# Patient Record
Sex: Male | Born: 1990 | Race: Black or African American | Hispanic: No | Marital: Single | Smoking: Never smoker
Health system: Southern US, Community
[De-identification: ages and names within clinical notes are randomized; demographics above are authoritative.]

---

## 2015-04-19 ENCOUNTER — Emergency Department: Payer: BLUE CROSS/BLUE SHIELD

## 2015-04-19 ENCOUNTER — Emergency Department
Admission: EM | Admit: 2015-04-19 | Discharge: 2015-04-19 | Disposition: A | Discharge status: Home / Self Care | Payer: BLUE CROSS/BLUE SHIELD | Attending: Emergency Medicine | Admitting: Emergency Medicine

## 2015-04-19 DIAGNOSIS — R0789 Other chest pain: Secondary | ICD-10-CM

## 2015-04-19 DIAGNOSIS — R1084 Generalized abdominal pain: Secondary | ICD-10-CM | POA: Diagnosis present

## 2015-04-19 DIAGNOSIS — F17200 Nicotine dependence, unspecified, uncomplicated: Secondary | ICD-10-CM | POA: Insufficient documentation

## 2015-04-19 LAB — URINALYSIS COMPLETE WITH MICROSCOPIC (ARMC ONLY)
BILIRUBIN URINE: NEGATIVE
Bacteria, UA: NONE SEEN
Glucose, UA: NEGATIVE mg/dL
Hgb urine dipstick: NEGATIVE
Ketones, ur: NEGATIVE mg/dL
Leukocytes, UA: NEGATIVE
Nitrite: NEGATIVE
PH: 5 (ref 5.0–8.0)
PROTEIN: NEGATIVE mg/dL
Specific Gravity, Urine: 1.025 (ref 1.005–1.030)

## 2015-04-19 LAB — LIPASE, BLOOD: LIPASE: 30 U/L (ref 11–51)

## 2015-04-19 LAB — COMPREHENSIVE METABOLIC PANEL
ALBUMIN: 4.2 g/dL (ref 3.5–5.0)
ALT: 28 U/L (ref 17–63)
AST: 22 U/L (ref 15–41)
Alkaline Phosphatase: 72 U/L (ref 38–126)
Anion gap: 6 (ref 5–15)
BUN: 13 mg/dL (ref 6–20)
CHLORIDE: 104 mmol/L (ref 101–111)
CO2: 27 mmol/L (ref 22–32)
CREATININE: 1.09 mg/dL (ref 0.61–1.24)
Calcium: 9.5 mg/dL (ref 8.9–10.3)
GFR calc non Af Amer: >60 mL/min (ref >60)
Glucose, Bld: 97 mg/dL (ref 65–99)
Potassium: 3.7 mmol/L (ref 3.5–5.1)
SODIUM: 137 mmol/L (ref 135–145)
Total Bilirubin: 0.5 mg/dL (ref 0.3–1.2)
Total Protein: 7.6 g/dL (ref 6.5–8.1)

## 2015-04-19 LAB — CBC
HCT: 43.2 % (ref 40.0–52.0)
Hemoglobin: 15 g/dL (ref 13.0–18.0)
MCH: 31.1 pg (ref 26.0–34.0)
MCHC: 34.6 g/dL (ref 32.0–36.0)
MCV: 89.7 fL (ref 80.0–100.0)
PLATELETS: 237 10*3/uL (ref 150–440)
RBC: 4.82 MIL/uL (ref 4.40–5.90)
RDW: 12.5 % (ref 11.5–14.5)
WBC: 4.6 10*3/uL (ref 3.8–10.6)

## 2015-04-19 MED ORDER — FAMOTIDINE 20 MG PO TABS: 20.0000 mg | ORAL_TABLET | Freq: Two times a day (BID) | ORAL | Status: AC

## 2015-04-19 MED ORDER — NAPROXEN 500 MG PO TABS: 500.0000 mg | ORAL_TABLET | Freq: Two times a day (BID) | ORAL | Status: AC

## 2015-04-19 NOTE — ED Notes (Signed)
Pt arrives to ER via POV c/o abdominal pain in lower quadrants, sharp. Chest tightness intermittently and fatigue. PT states that these sx has been going on "for a while". Pt alert and oriented X4, active, cooperative, pt in NAD. RR even and unlabored, color WNL.   Denies NVD.

## 2015-04-19 NOTE — Discharge Instructions (Signed)
Abdominal Pain, Adult °Many things can cause abdominal pain. Usually, abdominal pain is not caused by a disease and will improve without treatment. It can often be observed and treated at home. Your health care provider will do a physical exam and possibly order blood tests and X-rays to help determine the seriousness of your pain. However, in many cases, more time must pass before a clear cause of the pain can be found. Before that point, your health care provider may not know if you need more testing or further treatment. °HOME CARE INSTRUCTIONS °Monitor your abdominal pain for any changes. The following actions may help to alleviate any discomfort you are experiencing: °· Only take over-the-counter or prescription medicines as directed by your health care provider. °· Do not take laxatives unless directed to do so by your health care provider. °· Try a clear liquid diet (broth, tea, or water) as directed by your health care provider. Slowly move to a bland diet as tolerated. °SEEK MEDICAL CARE IF: °· You have unexplained abdominal pain. °· You have abdominal pain associated with nausea or diarrhea. °· You have pain when you urinate or have a bowel movement. °· You experience abdominal pain that wakes you in the night. °· You have abdominal pain that is worsened or improved by eating food. °· You have abdominal pain that is worsened with eating fatty foods. °· You have a fever. °SEEK IMMEDIATE MEDICAL CARE IF: °· Your pain does not go away within 2 hours. °· You keep throwing up (vomiting). °· Your pain is felt only in portions of the abdomen, such as the right side or the left lower portion of the abdomen. °· You pass bloody or black tarry stools. °MAKE SURE YOU: °· Understand these instructions. °· Will watch your condition. °· Will get help right away if you are not doing well or get worse. °  °This information is not intended to replace advice given to you by your health care provider. Make sure you discuss  any questions you have with your health care provider. °  °Document Released: 10/12/2004 Document Revised: 09/23/2014 Document Reviewed: 09/11/2012 °Elsevier Interactive Patient Education ©2016 [employer] ° °Chest Wall Pain °Chest wall pain is pain in or around the bones and muscles of your chest. Sometimes, an injury causes this pain. Sometimes, the cause may not be known. This pain may take several weeks or longer to get better. °HOME CARE INSTRUCTIONS  °Pay attention to any changes in your symptoms. Take these actions to help with your pain:  °· Rest as told by your health care provider.   °· Avoid activities that cause pain. These include any activities that use your chest muscles or your abdominal and side muscles to lift heavy items.    °· If directed, apply ice to the painful area: °¨ Put ice in a plastic bag. °¨ Place a towel between your skin and the bag. °¨ Leave the ice on for 20 minutes, 2-3 times per day. °· Take over-the-counter and prescription medicines only as told by your health care provider. °· Do not use tobacco products, including cigarettes, chewing tobacco, and e-cigarettes. If you need help quitting, ask your health care provider. °· Keep all follow-up visits as told by your health care provider. This is important. °SEEK MEDICAL CARE IF: °· You have a fever. °· Your chest pain becomes worse. °· You have new symptoms. °SEEK IMMEDIATE MEDICAL CARE IF: °· You have nausea or vomiting. °· You feel sweaty or light-headed. °· You have   a cough with phlegm (sputum) or you cough up blood.  You develop shortness of breath.   This information is not intended to replace advice given to you by your health care provider. Make sure you discuss any questions you have with your health care provider.   Document Released: 01/02/2005 Document Revised: 09/23/2014 Document Reviewed: 03/30/2014 Elsevier Interactive Patient Education [employer]

## 2015-04-19 NOTE — ED Notes (Signed)
Pt reports  Left side chest pain and abd pain.  Pt states he has felt tired since yesterday.   Denies n/v/d.   Pt alert.  Speech clear.

## 2015-04-19 NOTE — ED Provider Notes (Signed)
[facility] Emergency Department Provider Note  ____________________________________________  Time seen: 4:40 PM  I have reviewed the triage vital signs and the nursing notes.   HISTORY  Chief Complaint Abdominal Pain and Fatigue    HPI Adrian Newman is a 25 y.o. male who complains of central chest pain that is nonradiating that started yesterday. It is intermittent lasting for only a second at a time with sharp pangs of pain. No shortness of breath. Not exertional, not pleuritic. No other aggravating or alleviating factors. Has some nasal congestion but denies cough or sore throat. No fevers or chills. Also complains of some diffuse abdominal pain without nausea vomiting or diarrhea. He is tolerating oral intake.     History reviewed. No pertinent past medical history.   There are no active problems to display for this patient.    History reviewed. No pertinent past surgical history.   Current Outpatient Rx  Name  Route  Sig  Dispense  Refill  . famotidine (PEPCID) 20 MG tablet   Oral   Take 1 tablet (20 mg total) by mouth 2 (two) times daily.   60 tablet   0   . naproxen (NAPROSYN) 500 MG tablet   Oral   Take 1 tablet (500 mg total) by mouth 2 (two) times daily with a meal.   20 tablet   0      Allergies Review of patient's allergies indicates no known allergies.   No family history on file.  Social History Social History  Substance Use Topics  . Smoking status: Current Every Day Smoker  . Smokeless tobacco: None  . Alcohol Use: No    Review of Systems  Constitutional:   No fever or chills. No weight changes Eyes:   No vision changes.  ENT:   No sore throat. Positive congestion Cardiovascular:   Positive chest pain as above. Respiratory:   No dyspnea or cough. Gastrointestinal:   Positive generalized abdominal pain without vomiting or diarrhea.  No BRBPR or melena. Genitourinary:   Negative for dysuria or difficulty  urinating. Musculoskeletal:   Negative for focal pain or swelling Skin:   Negative for rash. Neurological:   Negative for headaches, focal weakness or numbness.  10-point ROS otherwise negative.  ____________________________________________   PHYSICAL EXAM:  VITAL SIGNS: ED Triage Vitals  Enc Vitals Group     BP 04/19/15 1458 133/110 mmHg     Pulse Rate 04/19/15 1458 72     Resp 04/19/15 1458 18     Temp 04/19/15 1458 98.2 F (36.8 C)     Temp Source 04/19/15 1458 Oral     SpO2 04/19/15 1458 100 %     Weight 04/19/15 1458 200 lb (90.719 kg)     Height 04/19/15 1458  (1.88 m)     Head Cir --      Peak Flow --      Pain Score 04/19/15 1458 7     Pain Loc --      Pain Edu? --      Excl. in GC? --     Vital signs reviewed, nursing assessments reviewed.   Constitutional:   Alert and oriented. Well appearing and in no distress. Eyes:   No scleral icterus. No conjunctival pallor. PERRL. EOMI ENT   Head:   Normocephalic and atraumatic.   Nose:   No congestion/rhinnorhea. No septal hematoma   Mouth/Throat:   MMM, positive pharyngeal erythema. No peritonsillar mass.    Neck:  No stridor. No SubQ emphysema. No meningismus. Hematological/Lymphatic/Immunilogical:   No cervical lymphadenopathy. Cardiovascular:   RRR. Symmetric bilateral radial and DP pulses.  No murmurs.  Respiratory:   Normal respiratory effort without tachypnea nor retractions. Breath sounds are clear and equal bilaterally. No wheezes/rales/rhonchi. Gastrointestinal:   Soft with diffuse mild tenderness. No focal findings.. Non distended. There is no CVA tenderness.  No rebound, rigidity, or guarding. Genitourinary:   deferred Musculoskeletal:   Nontender with normal range of motion in all extremities. No joint effusions.  No lower extremity tenderness.  No edema. Anterior chest wall tender over the sternum and costal margins which reproduces his pain. Neurologic:   Normal speech and language.   CN 2-10 normal. Motor grossly intact. No gross focal neurologic deficits are appreciated.  Skin:    Skin is warm, dry and intact. No rash noted.  No petechiae, purpura, or bullae. Psychiatric:   Mood and affect are normal. ____________________________________________    LABS (pertinent positives/negatives) (all labs ordered are listed, but only abnormal results are displayed) Labs Reviewed  URINALYSIS COMPLETEWITH MICROSCOPIC ([facility] ONLY) - Abnormal; Notable for the following:    Color, Urine YELLOW (*)    APPearance CLEAR (*)    Squamous Epithelial / LPF 0-5 (*)    All other components within normal limits  LIPASE, BLOOD  COMPREHENSIVE METABOLIC PANEL  CBC   ____________________________________________   EKG  Interpreted by me  Date: 04/19/2015  Rate: 70  Rhythm: normal sinus rhythm  QRS Axis: normal  Intervals: normal  ST/T Wave abnormalities: normal  Conduction Disutrbances: none  Narrative Interpretation: unremarkable      ____________________________________________    RADIOLOGY  Chest x-ray unremarkable  ____________________________________________   PROCEDURES   ____________________________________________   INITIAL IMPRESSION / ASSESSMENT AND PLAN / ED COURSE  Pertinent labs & imaging results that were available during my care of the patient were reviewed by me and considered in my medical decision making (see chart for details).  Patient presents with a day worth of chest wall pain and fatigue. Also complains some subacute to chronic generalized abdominal pain. Exam is reassuring and nonfocal. This appears to be chest wall pain. He multitude of symptoms, likely that this is due to onset of a viral illness. He is well-appearing with unremarkable vital signs. Labs are unremarkable. He is calm and comfortable and tolerating oral intake. We'll have him take antacids and NSAIDs and follow up with primary  care.     ____________________________________________   FINAL CLINICAL IMPRESSION(S) / ED DIAGNOSES  Final diagnoses:  Chest wall pain  Generalized abdominal pain      Sharman CheekPhillip Belisa Eichholz, MD 04/19/15 1707

## 2016-02-23 ENCOUNTER — Emergency Department
Admission: EM | Admit: 2016-02-23 | Discharge: 2016-02-23 | Disposition: A | Discharge status: Home / Self Care | Payer: BLUE CROSS/BLUE SHIELD | Attending: Emergency Medicine | Admitting: Emergency Medicine

## 2016-02-23 ENCOUNTER — Encounter: Payer: Self-pay | Admitting: Emergency Medicine

## 2016-02-23 DIAGNOSIS — J029 Acute pharyngitis, unspecified: Secondary | ICD-10-CM | POA: Insufficient documentation

## 2016-02-23 DIAGNOSIS — F17200 Nicotine dependence, unspecified, uncomplicated: Secondary | ICD-10-CM | POA: Diagnosis not present

## 2016-02-23 DIAGNOSIS — R69 Illness, unspecified: Secondary | ICD-10-CM

## 2016-02-23 DIAGNOSIS — J111 Influenza due to unidentified influenza virus with other respiratory manifestations: Secondary | ICD-10-CM

## 2016-02-23 LAB — POCT RAPID STREP A: STREPTOCOCCUS, GROUP A SCREEN (DIRECT): NEGATIVE

## 2016-02-23 MED ORDER — IBUPROFEN 600 MG PO TABS
600.0000 mg | ORAL_TABLET | Freq: Once | ORAL | Status: AC
  Administered 2016-02-23: 600 mg via ORAL
  Filled 2016-02-23: qty 1

## 2016-02-23 MED ORDER — DEXAMETHASONE 1 MG/ML PO CONC: 10.0000 mg | Freq: Once | ORAL | Status: DC

## 2016-02-23 MED ORDER — OSELTAMIVIR PHOSPHATE 75 MG PO CAPS
75.0000 mg | ORAL_CAPSULE | Freq: Once | ORAL | Status: DC
  Filled 2016-02-23: qty 1

## 2016-02-23 MED ORDER — ACETAMINOPHEN 325 MG PO TABS
650.0000 mg | ORAL_TABLET | Freq: Once | ORAL | Status: AC | PRN
  Administered 2016-02-23: 650 mg via ORAL

## 2016-02-23 MED ORDER — ACETAMINOPHEN 325 MG PO TABS
ORAL_TABLET | ORAL | Status: AC
  Administered 2016-02-23: 650 mg via ORAL
  Filled 2016-02-23: qty 2

## 2016-02-23 MED ORDER — OSELTAMIVIR PHOSPHATE 75 MG PO CAPS: 75.0000 mg | ORAL_CAPSULE | Freq: Two times a day (BID) | ORAL | 0 refills | Status: AC

## 2016-02-23 MED ORDER — DEXAMETHASONE SODIUM PHOSPHATE 10 MG/ML IJ SOLN
INTRAMUSCULAR | Status: AC
  Administered 2016-02-23: 10 mg via ORAL
  Filled 2016-02-23: qty 1

## 2016-02-23 NOTE — ED Notes (Signed)
Upon assessment pt reports sore throat and fever.

## 2016-02-23 NOTE — ED Triage Notes (Signed)
Pt c/o sore throat and body chills x 2 days. Denies fever, n/v/d or abd pain.

## 2016-02-23 NOTE — ED Provider Notes (Signed)
[facility] Emergency Department Provider Note   ____________________________________________   First MD Initiated Contact with Patient 02/23/16 [phone]     (approximate)  I have reviewed the triage vital signs and the nursing notes.   HISTORY  Chief Complaint Sore Throat   HPI Adrian Newman is a 26 y.o. male who comes into the hospital today with chills, weakness, throat pain. The patient reports that it started on Monday night. He's had some vein in his throat when he breathes the cold air outside and he's had some occasional pain with swallowing. The patient has had some fevers and body aches. He denies any cough, sick contacts, nausea or vomiting. The patient also denies any shortness of breath. He has not taken anything for his symptoms. The patient does not have a primary care physician and is here today to get checked. He rates his pain a 6 out of 10 in intensity. Nothing seems to make his symptoms better or worse.   History reviewed. No pertinent past medical history.  There are no active problems to display for this patient.   History reviewed. No pertinent surgical history.  Prior to Admission medications   Medication Sig Start Date End Date Taking? Authorizing Provider  famotidine (PEPCID) 20 MG tablet Take 1 tablet (20 mg total) by mouth 2 (two) times daily. 04/19/15   Sharman CheekPhillip Stafford, MD  naproxen (NAPROSYN) 500 MG tablet Take 1 tablet (500 mg total) by mouth 2 (two) times daily with a meal. 04/19/15   Sharman CheekPhillip Stafford, MD  oseltamivir (TAMIFLU) 75 MG capsule Take 1 capsule (75 mg total) by mouth 2 (two) times daily. 02/23/16 02/28/16  Rebecka ApleyAllison P Webster, MD    Allergies Patient has no known allergies.  No family history on file.  Social History Social History  Substance Use Topics  . Smoking status: Current Some Day Smoker  . Smokeless tobacco: Never Used     Comment: somkes "black and mild every now and then"  . Alcohol use No     Review of Systems Constitutional:  fever/chills Eyes: No visual changes. ENT: sore throat. Cardiovascular: Denies chest pain. Respiratory: Denies shortness of breath. Gastrointestinal: No abdominal pain.  No nausea, no vomiting.  No diarrhea.  No constipation. Genitourinary: Negative for dysuria. Musculoskeletal: Negative for back pain. Skin: Negative for rash. Neurological: Generalized weakness  10-point ROS otherwise negative.  ____________________________________________   PHYSICAL EXAM:  VITAL SIGNS: ED Triage Vitals  Enc Vitals Group     BP 02/23/16 0125 125/82     Pulse Rate 02/23/16 0125 94     Resp 02/23/16 0125 18     Temp 02/23/16 0125 (!) 101 F (38.3 C)     Temp Source 02/23/16 0125 Oral     SpO2 02/23/16 0125 99 %     Weight 02/23/16 0125 220 lb (99.8 kg)     Height 02/23/16 0125 6\' 3"  (1.905 m)     Head Circumference --      Peak Flow --      Pain Score 02/23/16 0126 6     Pain Loc --      Pain Edu? --      Excl. in GC? --     Constitutional: Alert and oriented. Well appearing and in Mild distress. Eyes: Conjunctivae are normal. PERRL. EOMI. Head: Atraumatic. Nose: No congestion/rhinnorhea. Mouth/Throat: Mucous membranes are moist.  Oropharynx non-erythematous. Cardiovascular: Normal rate, regular rhythm. Grossly normal heart sounds.  Good peripheral circulation. Respiratory: Normal respiratory effort.  No retractions. Lungs CTAB. Gastrointestinal: Soft and nontender. No distention. Positive bowel sounds Musculoskeletal: No lower extremity tenderness nor edema.  Neurologic:  Normal speech and language.  Skin:  Skin is warm, dry and intact.  Psychiatric: Mood and affect are normal. Speech and behavior are normal.  ____________________________________________   LABS (all labs ordered are listed, but only abnormal results are displayed)  Labs Reviewed  CULTURE, GROUP A STREP [facility])  POCT RAPID STREP A    ____________________________________________  EKG  none ____________________________________________  RADIOLOGY  none ____________________________________________   PROCEDURES  Procedure(s) performed: None  Procedures  Critical Care performed: No  ____________________________________________   INITIAL IMPRESSION / ASSESSMENT AND PLAN / ED COURSE  Pertinent labs & imaging results that were available during my care of the patient were reviewed by me and considered in my medical decision making (see chart for details).  This is a 25 year old male who comes into the hospital today with some sore throat, fevers chills and weakness. The patient sounds that he might have a flulike illness. I did give the patient a dose of dexamethasone as well as ibuprofen and Tylenol. He had a strep swab that was negative. Given the patient's symptoms I will give him a dose of Tamiflu. I will encourage the patient to follow-up with an acute care clinic. Otherwise the patient will be discharged to home. He had no further questions or concerns at this time.      ____________________________________________   FINAL CLINICAL IMPRESSION(S) / ED DIAGNOSES  Final diagnoses:  Influenza-like illness  Sore throat      NEW MEDICATIONS STARTED DURING THIS VISIT:  New Prescriptions   OSELTAMIVIR (TAMIFLU) 75 MG CAPSULE    Take 1 capsule (75 mg total) by mouth 2 (two) times daily.     Note:  This document was prepared using Dragon voice recognition software and may include unintentional dictation errors.    Rebecka Apley, MD 02/23/16 [phone]

## 2016-02-25 LAB — CULTURE, GROUP A STREP (THRC)

## 2016-06-04 ENCOUNTER — Emergency Department
Admission: EM | Admit: 2016-06-04 | Discharge: 2016-06-04 | Disposition: A | Discharge status: Home / Self Care | Payer: BLUE CROSS/BLUE SHIELD | Attending: Emergency Medicine | Admitting: Emergency Medicine

## 2016-06-04 ENCOUNTER — Encounter: Payer: Self-pay | Admitting: Emergency Medicine

## 2016-06-04 DIAGNOSIS — M7581 Other shoulder lesions, right shoulder: Secondary | ICD-10-CM | POA: Insufficient documentation

## 2016-06-04 DIAGNOSIS — M25511 Pain in right shoulder: Secondary | ICD-10-CM

## 2016-06-04 DIAGNOSIS — F17200 Nicotine dependence, unspecified, uncomplicated: Secondary | ICD-10-CM | POA: Diagnosis not present

## 2016-06-04 MED ORDER — PREDNISONE 10 MG PO TABS: ORAL_TABLET | ORAL | 0 refills | Status: AC

## 2016-06-04 NOTE — ED Provider Notes (Signed)
[facility]-EMERGENCY DEPARTMENT Provider Note   CSN: [id number] Arrival date & time: 06/04/16  1004     History   Chief Complaint Chief Complaint  Patient presents with  . Shoulder Pain    HPI Adrian Newman is a 26 y.o. male presents to the emergency department for evaluation of right shoulder pain. Pain isn't present for 2 weeks. No trauma or injury, please he could have injured the shoulder lifting a heavy exhaust manifold 2 weeks ago. He denies any pain at the time of the injury but did have pain 1-2 days following this activity. He has been taking ibuprofen 600 mg 3-4 times daily with no improvement. He describes the pain as a dull ache along the lateral deltoid that is made worse with abduction and flexion greater than 90. He denies any numbness tingling or radicular symptoms in the upper extremity. No increase in shoulder pain with neck range of motion.    Marland KitchenHPI  History reviewed. No pertinent past medical history.  There are no active problems to display for this patient.   History reviewed. No pertinent surgical history.     Home Medications    Prior to Admission medications   Medication Sig Start Date End Date Taking? Authorizing Provider  famotidine (PEPCID) 20 MG tablet Take 1 tablet (20 mg total) by mouth 2 (two) times daily. 04/19/15   Sharman Cheek, MD  naproxen (NAPROSYN) 500 MG tablet Take 1 tablet (500 mg total) by mouth 2 (two) times daily with a meal. 04/19/15   Sharman Cheek, MD  predniSONE (DELTASONE) 10 MG tablet 10 day taper. 5,5,4,4,3,3,2,2,1,1 06/04/16   Evon Slack, PA-C    Family History No family history on file.  Social History Social History  Substance Use Topics  . Smoking status: Current Some Day Smoker  . Smokeless tobacco: Never Used     Comment: somkes "black and mild every now and then"  . Alcohol use No     Allergies   Patient has no known allergies.   Review of Systems Review of Systems  Constitutional: Negative  for fever.  Respiratory: Negative for shortness of breath.   Cardiovascular: Negative for chest pain.  Musculoskeletal: Positive for arthralgias and myalgias. Negative for neck pain and neck stiffness.  Neurological: Negative for weakness and numbness.     Physical Exam Updated Vital Signs BP 120/73 (BP Location: Left Arm)   Pulse 62   Temp 98.8 F (37.1 C) (Oral)   Resp 16   Ht 6\' 2"  (1.88 m)   Wt 210 lb (95.3 kg)   SpO2 98%   BMI 26.96 kg/m   Physical Exam  Constitutional: He appears well-developed and well-nourished.  HENT:  Head: Normocephalic and atraumatic.  Right Ear: External ear normal.  Left Ear: External ear normal.  Eyes: Conjunctivae are normal.  Neck: Neck supple.  Cardiovascular: Normal rate and regular rhythm.   No murmur heard. Pulmonary/Chest: Effort normal and breath sounds normal. No respiratory distress.  Abdominal: Soft. There is no tenderness.  Musculoskeletal:  Right Upper Extremity: Examination of the right shoulder and arm showed no bony abnormality or edema.  Patient has normal range of motion of the right shoulder with abduction and flexion and internal and external rotation..  The patient has no pain with shoulder motion.  The patient has a positive Hawkins test and a positive Impingement test.  The patient has a negative drop arm test. The patient has a negative yergasons and speeds test.  The patient is non-tender  along the deltoid muscle.  There is moderate subacromial space tenderness with no AC joint tenderness.  The patient has no instability of the shoulder with anterior-posterior motion.  There is a negative sulcus sign.  The rotator cuff muscle strength is 5/5 with supraspinatus, 5/5 with internal rotation, and 5/5 with external rotation.  There is no crepitus with range of motion activities.    Neurological: The patient has sensation that is intact to light touch and pinprick bilaterally.  The patient has normal grip strength.  The patient  has full biceps, wrist extension, grip, and interosseous strength.  The patient has 2 + DTRs bilaterally.  Vascular: The patient has less than 2 second capillary refill.  The patient has normal ulnar and radial pulses.  The patient has normal warmth to touch.     Neurological: He is alert.  Skin: Skin is warm and dry.  Psychiatric: He has a normal mood and affect.  Nursing note and vitals reviewed.    ED Treatments / Results  Labs (all labs ordered are listed, but only abnormal results are displayed) Labs Reviewed - No data to display  EKG  EKG Interpretation None       Radiology No results found.  Procedures Procedures (including critical care time)  Medications Ordered in ED Medications - No data to display   Initial Impression / Assessment and Plan / ED Course  I have reviewed the triage vital signs and the nursing notes.  Pertinent labs & imaging results that were available during my care of the patient were reviewed by me and considered in my medical decision making (see chart for details).     26 year old male with right shoulder rotator cuff tendinitis. He is placed on a prednisone taper. Will discontinue ibuprofen. Will avoid activities and reproduce pain. Will follow-up with orthopedics if no improvement in 10 days. He is educated on signs and symptoms return to the ED for.  Final Clinical Impressions(s) / ED Diagnoses   Final diagnoses:  Rotator cuff tendonitis, right  Acute pain of right shoulder    New Prescriptions New Prescriptions   PREDNISONE (DELTASONE) 10 MG TABLET    10 day taper. 5,5,4,4,3,3,2,2,1,1     Evon SlackGaines, Malaki Koury C, PA-C 06/04/16 1055    [name], [name], MD 06/06/16 [phone]

## 2016-06-04 NOTE — Discharge Instructions (Signed)
Please take prednisone as prescribed. He may take additional Tylenol as needed. Do not take ibuprofen or Aleve while on the prednisone. Apply ice to the shoulder 20 minutes every hour. If no improvement in 10 days follow-up with Dr. Elenor LegatoHooten's office.

## 2016-06-04 NOTE — ED Triage Notes (Signed)
C/o right shoulder pain x 2 weeks  Denies injury.

## 2016-06-04 NOTE — ED Notes (Signed)
Developed right shoulder pain 2 weeks ago   Denies any injury  No deformity noted  States min relief with OTC meds

## 2016-06-23 ENCOUNTER — Encounter: Payer: Self-pay | Admitting: Emergency Medicine

## 2016-06-23 ENCOUNTER — Emergency Department
Admission: EM | Admit: 2016-06-23 | Discharge: 2016-06-24 | Disposition: A | Discharge status: Home / Self Care | Payer: BLUE CROSS/BLUE SHIELD | Attending: Emergency Medicine | Admitting: Emergency Medicine

## 2016-06-23 ENCOUNTER — Emergency Department: Payer: BLUE CROSS/BLUE SHIELD

## 2016-06-23 DIAGNOSIS — Z79899 Other long term (current) drug therapy: Secondary | ICD-10-CM | POA: Diagnosis not present

## 2016-06-23 DIAGNOSIS — R52 Pain, unspecified: Secondary | ICD-10-CM

## 2016-06-23 DIAGNOSIS — M25511 Pain in right shoulder: Secondary | ICD-10-CM | POA: Diagnosis present

## 2016-06-23 NOTE — ED Triage Notes (Addendum)
Patient with complaint of right shoulder pain times one month. Patient states that he was seen here about 2 weeks ago for the same and was given steroids. Patient states that he had no improvement with the steroids. Patient states that he would like an x-ray to see what is going on with his shoulder.

## 2016-06-24 MED ORDER — IBUPROFEN 600 MG PO TABS: 600.0000 mg | ORAL_TABLET | Freq: Four times a day (QID) | ORAL | 0 refills | Status: AC | PRN

## 2016-06-24 NOTE — Discharge Instructions (Signed)
Please call the number provided for orthopedics on Monday to arrange a follow-up appointment as soon as possible. As he discussed he may wear your sling as needed for comfort during the day but please remove in the afternoon/evening to work on range of motion exercises.

## 2016-06-24 NOTE — ED Provider Notes (Signed)
[facility] Emergency Department Provider Note  Time seen: 1:18 AM  I have reviewed the triage vital signs and the nursing notes.   HISTORY  Chief Complaint Shoulder Pain    HPI Adrian Newman is a 26 y.o. male with no past medical history presents to the emergency department with right shoulder pain. According to the patient for the past one month he has been experiencing pain in his right shoulder every day. He states the pain is getting worse. He was evaluated 2 weeks ago in the emergency department and discharged on a course of steroids. He states steroids did not appear to help. He has not followed up with orthopedics. She denies any trauma, denies any known injuries. Denies any chest pain trouble breathing nausea or diaphoresis. States the pain is very positional and worse with movement of the right shoulder.Describes as a dull pain moderate in severity occasionally severe with movement.  History reviewed. No pertinent past medical history.  There are no active problems to display for this patient.   History reviewed. No pertinent surgical history.  Prior to Admission medications   Medication Sig Start Date End Date Taking? Authorizing Provider  famotidine (PEPCID) 20 MG tablet Take 1 tablet (20 mg total) by mouth 2 (two) times daily. 04/19/15   Sharman CheekStafford, Phillip, MD  naproxen (NAPROSYN) 500 MG tablet Take 1 tablet (500 mg total) by mouth 2 (two) times daily with a meal. 04/19/15   Sharman CheekStafford, Phillip, MD  predniSONE (DELTASONE) 10 MG tablet 10 day taper. 5,5,4,4,3,3,2,2,1,1 06/04/16   Evon SlackGaines, Thomas C, PA-C    No Known Allergies  No family history on file.  Social History Social History  Substance Use Topics  . Smoking status: Never Smoker  . Smokeless tobacco: Never Used     Comment: somkes "black and mild every now and then"  . Alcohol use No    Review of Systems Constitutional: Negative for fever. Cardiovascular: Negative for chest  pain. Respiratory: Negative for shortness of breath. Gastrointestinal: Negative for abdominal pain Musculoskeletal: Right shoulder pain Skin: Negative for rash. Neurological: Negative for headaches, focal weakness or numbness. All other ROS negative  ____________________________________________   PHYSICAL EXAM:  VITAL SIGNS: ED Triage Vitals [06/23/16 2222]  Enc Vitals Group     BP 124/73     Pulse Rate 97     Resp 18     Temp 97.9 F (36.6 C)     Temp Source Oral     SpO2 97 %     Weight 200 lb (90.7 kg)     Height 6\' 3"  (1.905 m)     Head Circumference      Peak Flow      Pain Score 10     Pain Loc      Pain Edu?      Excl. in GC?     Constitutional: Alert and oriented. Well appearing and in no distress. Eyes: Normal exam ENT   Head: Normocephalic and atraumatic.   Mouth/Throat: Mucous membranes are moist. Cardiovascular: Normal rate, regular rhythm. No murmur Respiratory: Normal respiratory effort without tachypnea nor retractions. Breath sounds are clear  Gastrointestinal: Soft and nontender. No distention. Musculoskeletal: Moderate tenderness to palpation of the right shoulder. Neurovascularly intact distally. Able to make a fist. Patient does have pain with passive and active movement in the right shoulder. No edema or erythema noted. Neurologic:  Normal speech and language. No gross focal neurologic deficits  Skin:  Skin is warm, dry and  intact.  Psychiatric: Mood and affect are normal.   ____________________________________________   RADIOLOGY  X-rays negative  ____________________________________________   INITIAL IMPRESSION / ASSESSMENT AND PLAN / ED COURSE  Pertinent labs & imaging results that were available during my care of the patient were reviewed by me and considered in my medical decision making (see chart for details).  Patient presents to the emergency department with 1 month of right shoulder pain worse with movement. Patient  states the steroids did not help his discomfort. Patient's x-rays negative. On exam the patient does have moderate tenderness to palpation of the right shoulder with moderate pain elicited with active and passive range of motion. We'll place in a sling for comfort which have instructed the patient to wear during the day but to remove in the afternoons and evening to work on range of motion exercises. We will discharge with ibuprofen and orthopedics follow-up. I discussed with the patient importance of following up with orthopedics for further evaluation.  ____________________________________________   FINAL CLINICAL IMPRESSION(S) / ED DIAGNOSES  Right shoulder pain    Minna Antis, MD 06/24/16 0121

## 2018-06-30 IMAGING — CR DG SHOULDER 2+V*R*
1 series · 3 of 3 positions shown · non-contrast
Comparison: None.

CLINICAL DATA: Right shoulder pain for 1 month

EXAM:
RIGHT SHOULDER - 2+ VIEW

[Series 1: dg shoulder left · 0.14mm/px · 3 of 3 slices shown]
[im 1/3]
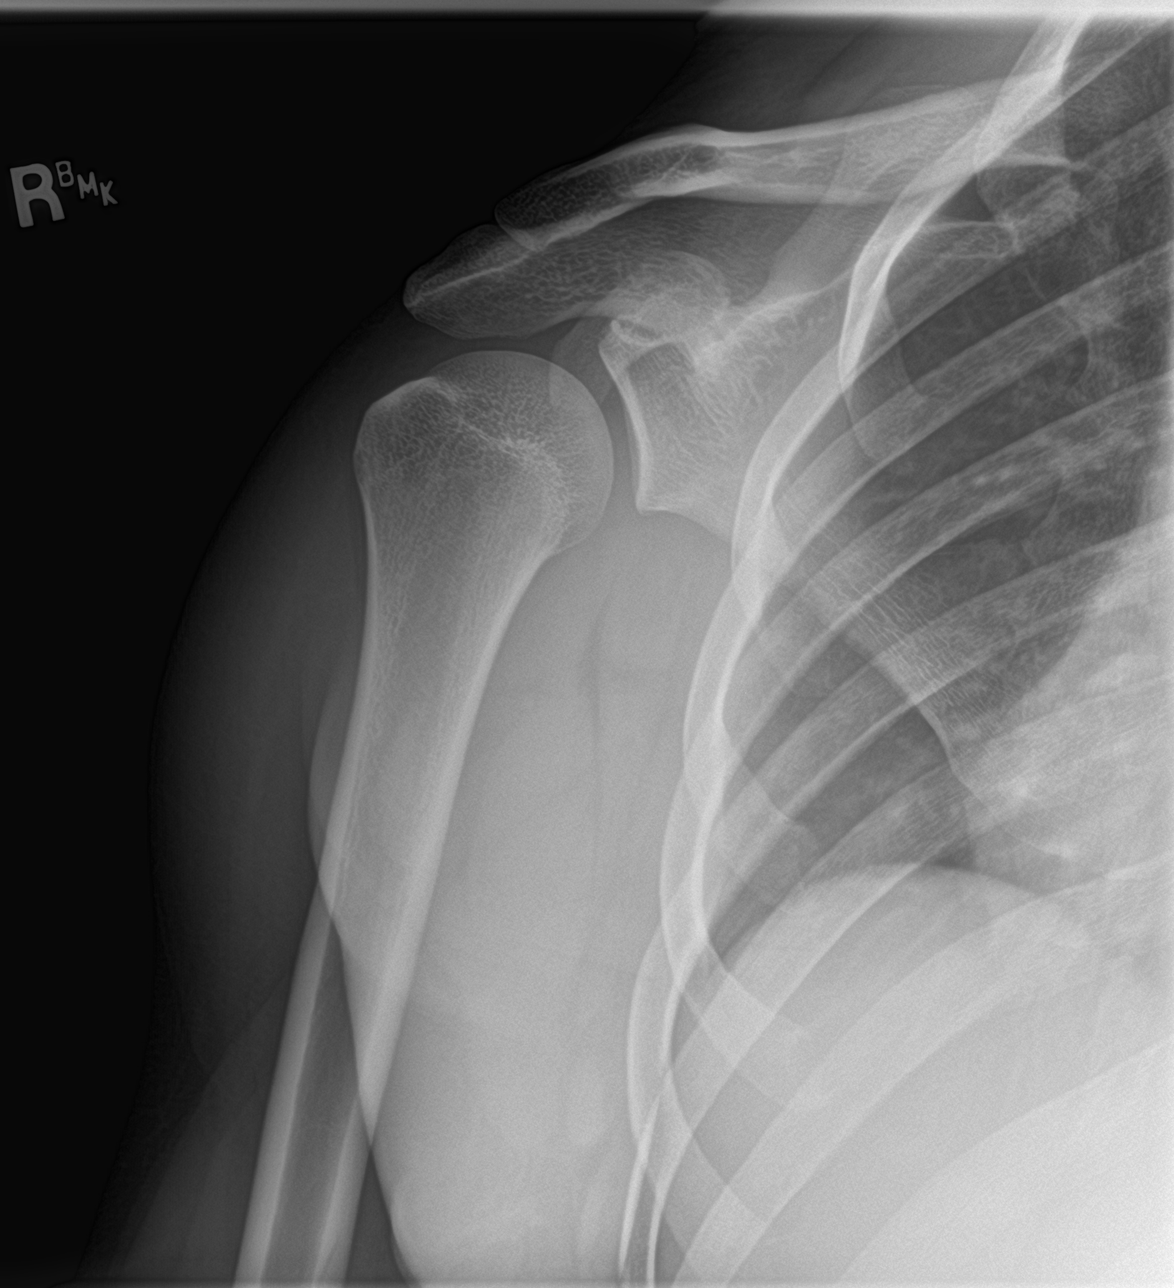
[im 2/3]
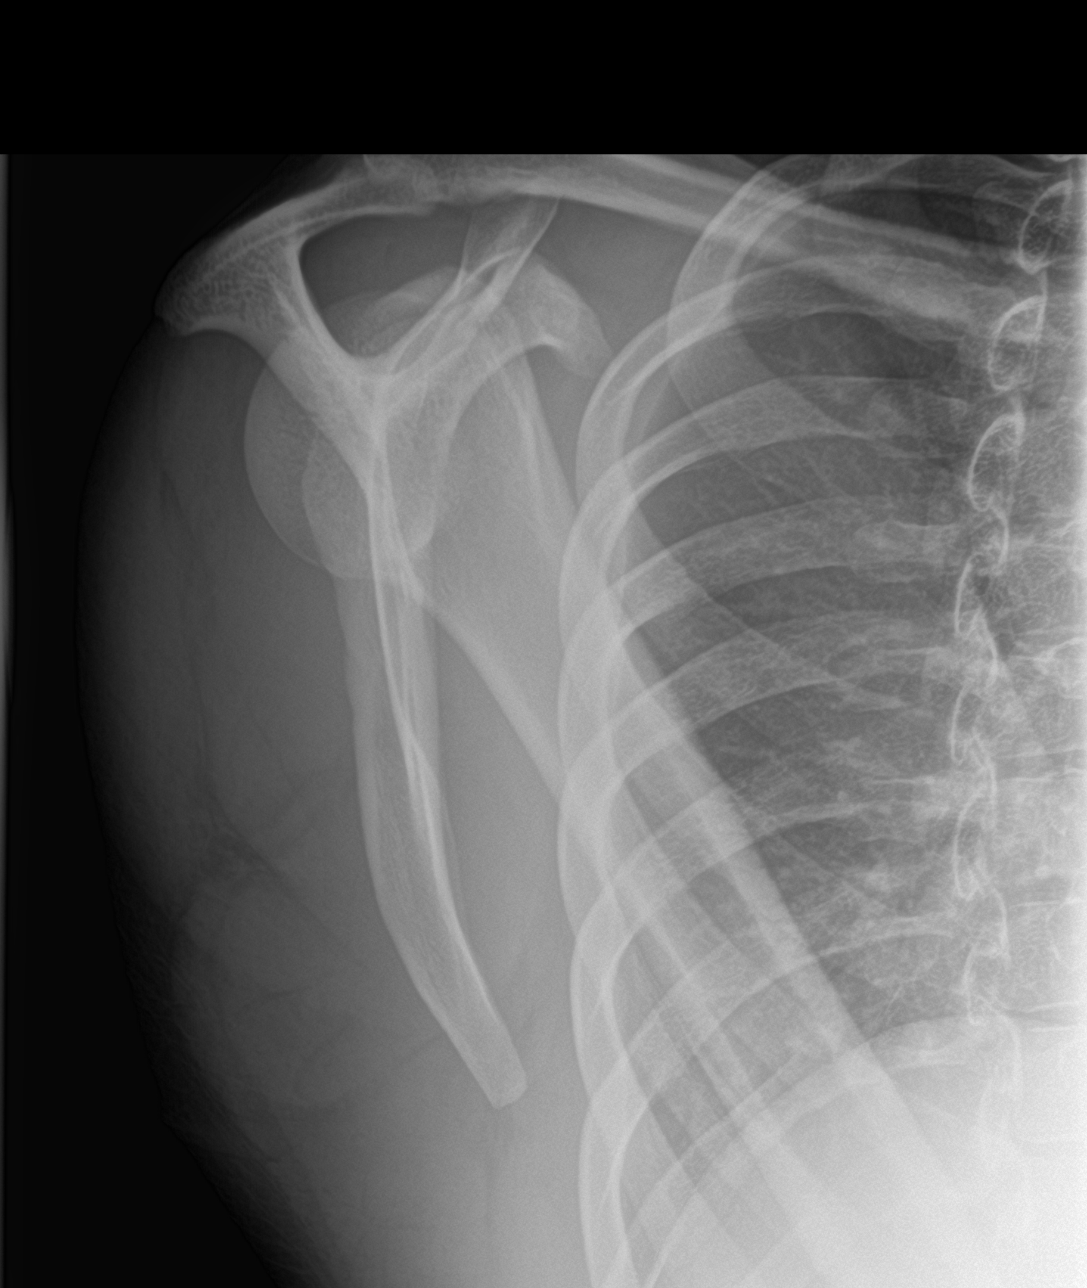
[im 3/3]
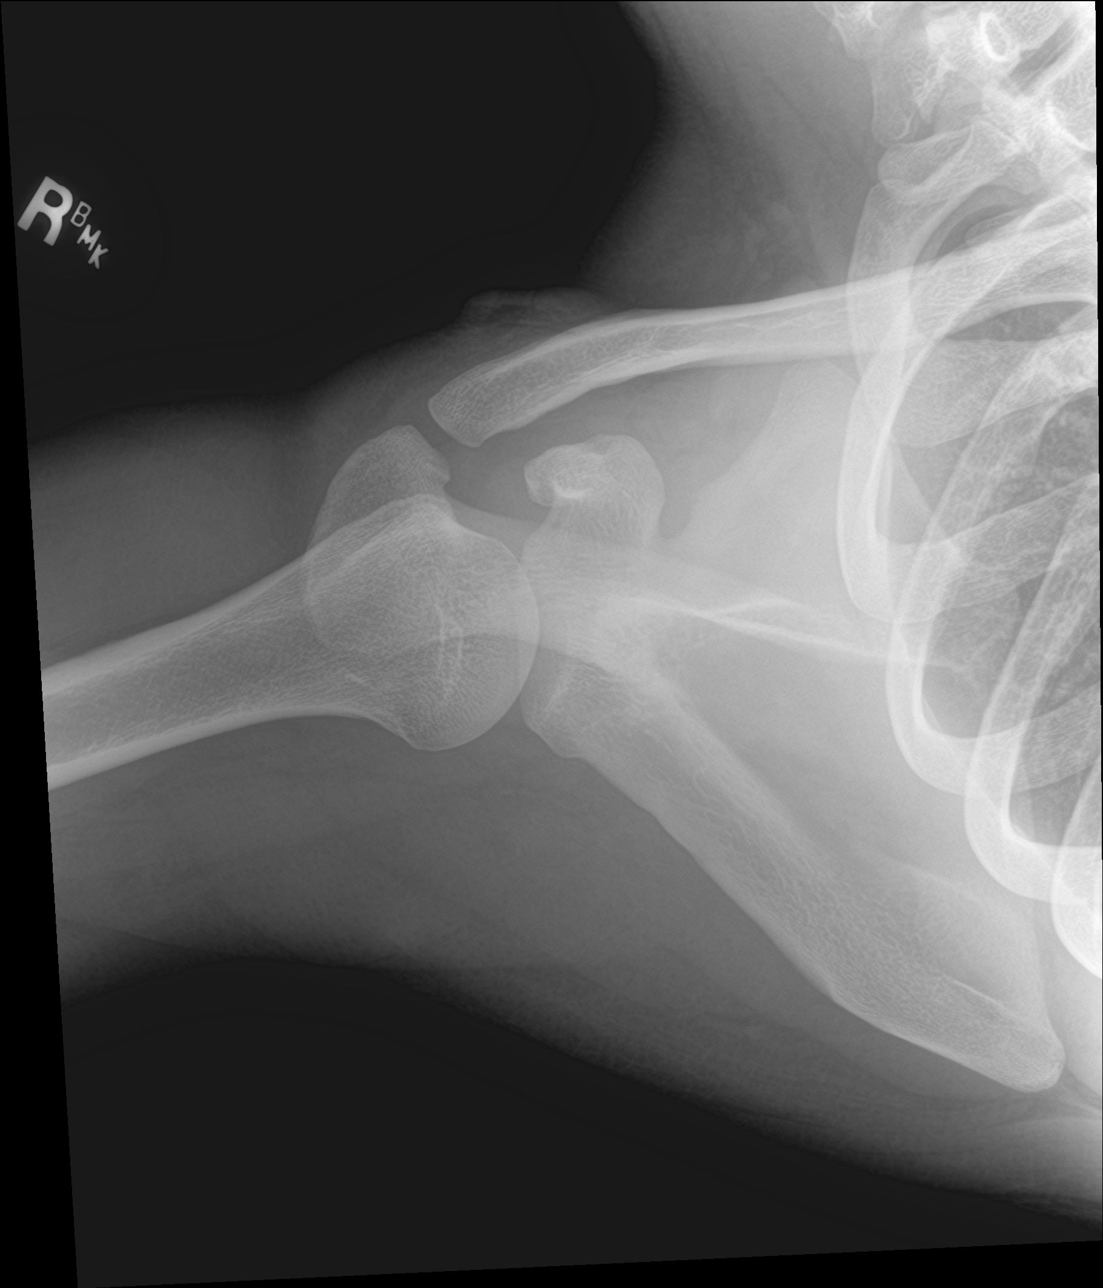

[3 of 3 positions shown; findings below may reference images not displayed]

FINDINGS: There is no evidence of fracture or dislocation. There is no
evidence of arthropathy or other focal bone abnormality. Soft
tissues are unremarkable.
IMPRESSION: Negative.
